# Patient Record
Sex: Female | Born: 1975 | Race: Black or African American | Hispanic: No | Marital: Single | Smoking: Never smoker
Health system: Southern US, Community
[De-identification: ages and names within clinical notes are randomized; demographics above are authoritative.]

## PROBLEM LIST (undated history)

## (undated) DIAGNOSIS — N390 Urinary tract infection, site not specified: Secondary | ICD-10-CM

## (undated) DIAGNOSIS — I10 Essential (primary) hypertension: Secondary | ICD-10-CM

---

## 2003-04-05 ENCOUNTER — Other Ambulatory Visit: Admission: RE | Admit: 2003-04-05 | Discharge: 2003-04-05 | Payer: Self-pay | Admitting: Obstetrics and Gynecology

## 2003-04-06 ENCOUNTER — Other Ambulatory Visit: Admission: RE | Admit: 2003-04-06 | Discharge: 2003-04-06 | Payer: Self-pay | Admitting: Obstetrics and Gynecology

## 2003-04-08 ENCOUNTER — Ambulatory Visit (HOSPITAL_COMMUNITY): Admission: RE | Admit: 2003-04-08 | Discharge: 2003-04-08 | Payer: Self-pay | Admitting: Obstetrics and Gynecology

## 2004-02-08 ENCOUNTER — Other Ambulatory Visit: Admission: RE | Admit: 2004-02-08 | Discharge: 2004-02-08 | Payer: Self-pay | Admitting: Obstetrics and Gynecology

## 2004-08-10 ENCOUNTER — Inpatient Hospital Stay (HOSPITAL_COMMUNITY): Admission: AD | Admit: 2004-08-10 | Discharge: 2004-08-10 | Payer: Self-pay | Admitting: Obstetrics and Gynecology

## 2004-08-11 ENCOUNTER — Inpatient Hospital Stay (HOSPITAL_COMMUNITY): Admission: AD | Admit: 2004-08-11 | Discharge: 2004-08-11 | Payer: Self-pay | Admitting: Obstetrics and Gynecology

## 2004-08-18 ENCOUNTER — Inpatient Hospital Stay (HOSPITAL_COMMUNITY): Admission: AD | Admit: 2004-08-18 | Discharge: 2004-08-21 | Payer: Self-pay | Admitting: Obstetrics and Gynecology

## 2004-09-29 ENCOUNTER — Other Ambulatory Visit: Admission: RE | Admit: 2004-09-29 | Discharge: 2004-09-29 | Payer: Self-pay | Admitting: Obstetrics and Gynecology

## 2005-05-22 IMAGING — US US OB LIMITED
1 series · 18 of 28 positions shown · non-contrast
Comparison: none

CLINICAL DATA: 37 weeks gestation with contractions.

[Series 1: us fetal bpp w/o nonstress · non-contrast · 18 of 29 slices shown]
[im 1/29]
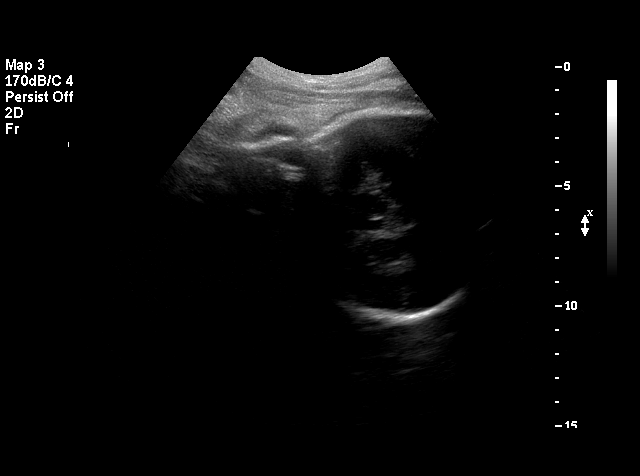
[im 3/29]
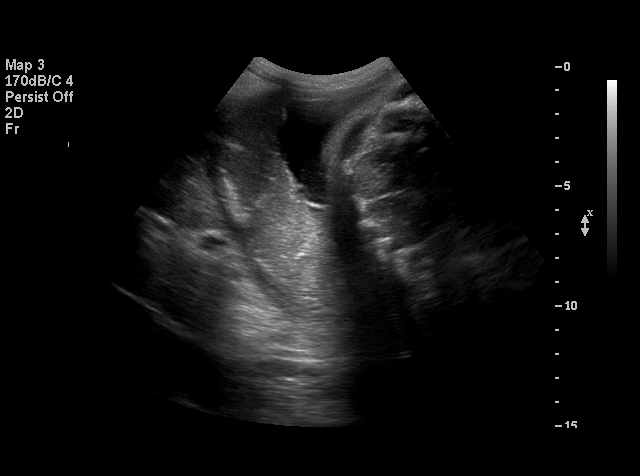
[im 4/29]
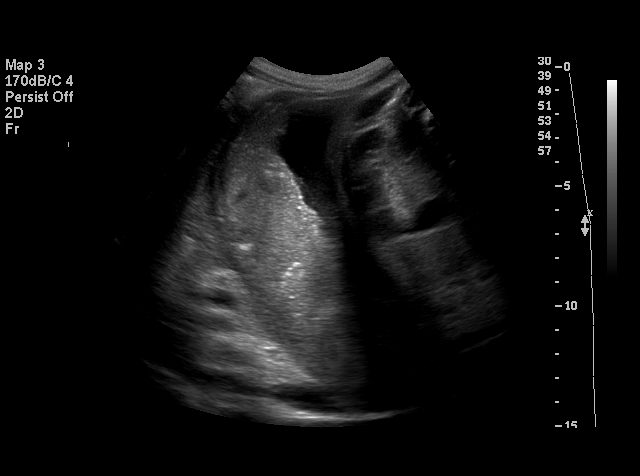
[im 6/29]
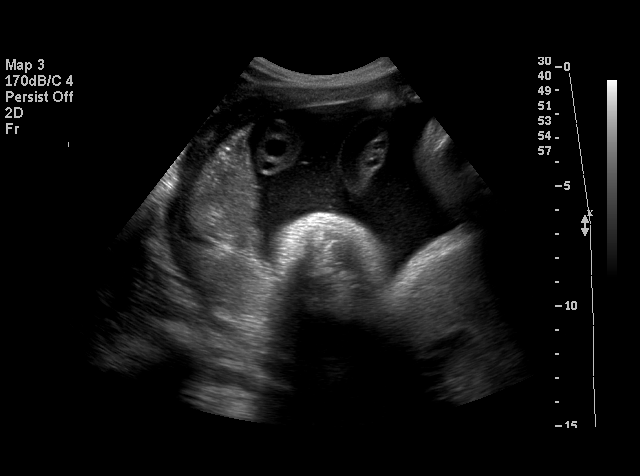
[im 8/29]
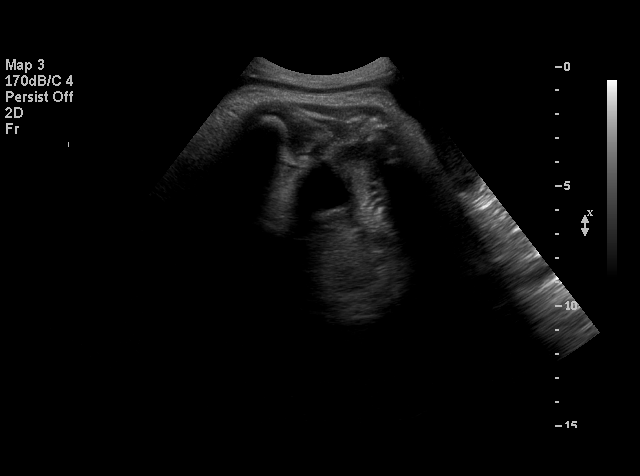
[im 9/29]
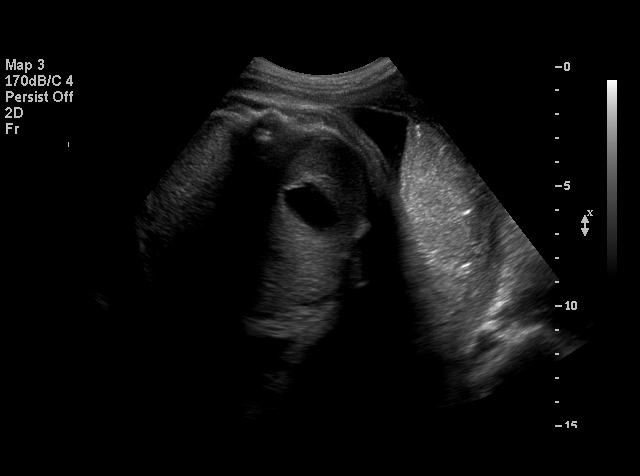
[im 11/29]
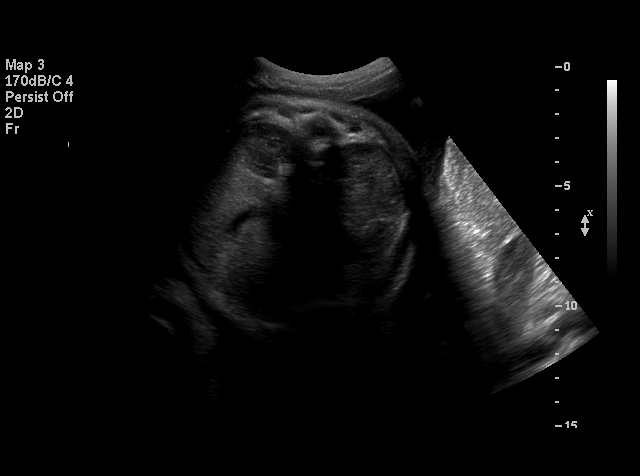
[im 12/29]
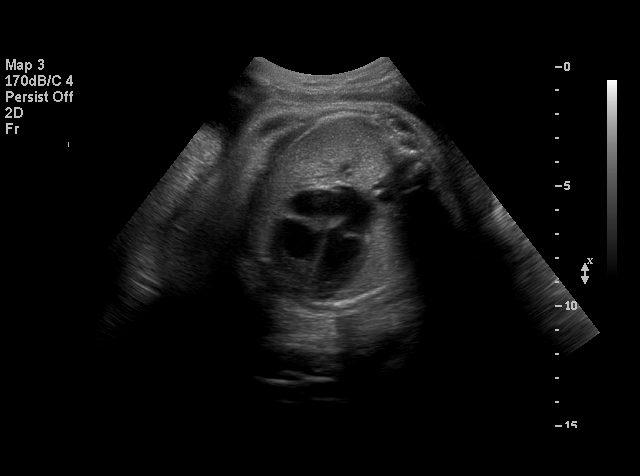
[im 14/29]
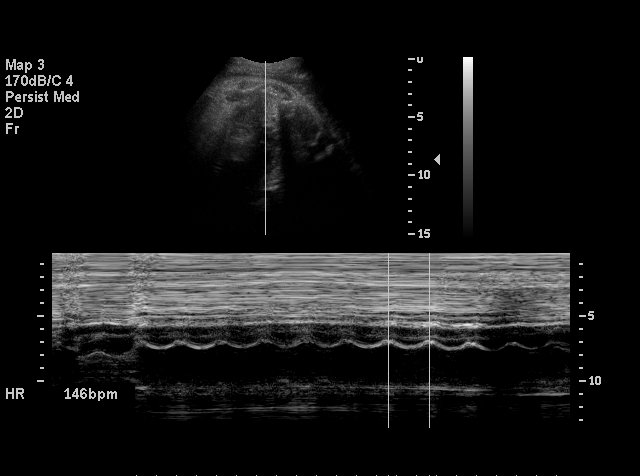
[im 15/29]
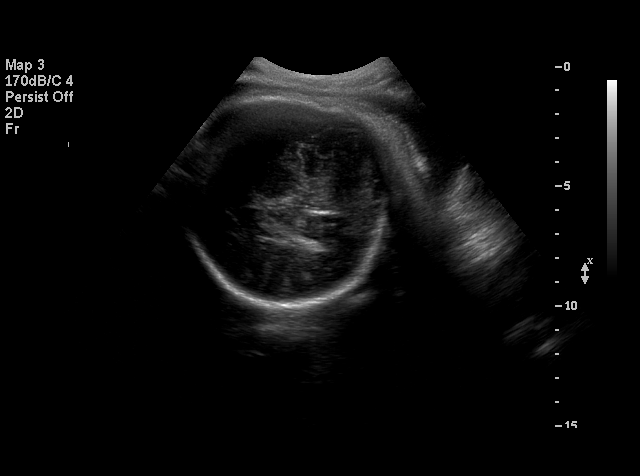
[im 17/29]
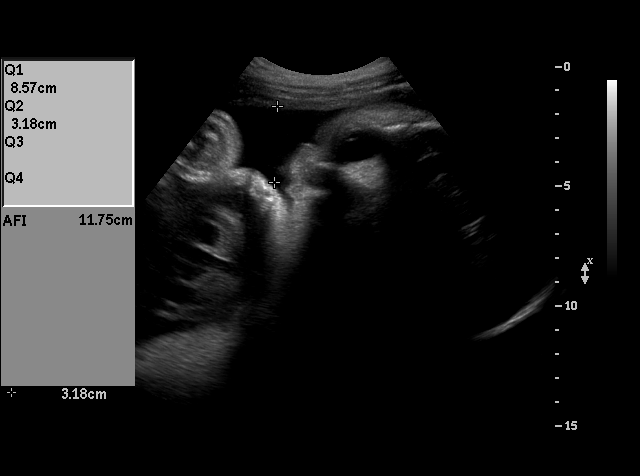
[im 18/29]
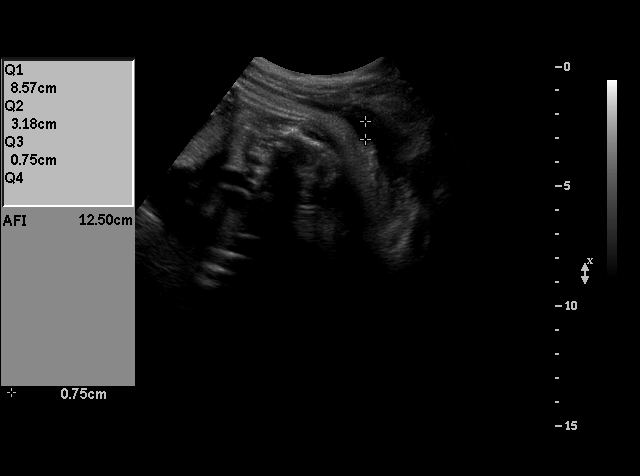
[im 20/29]
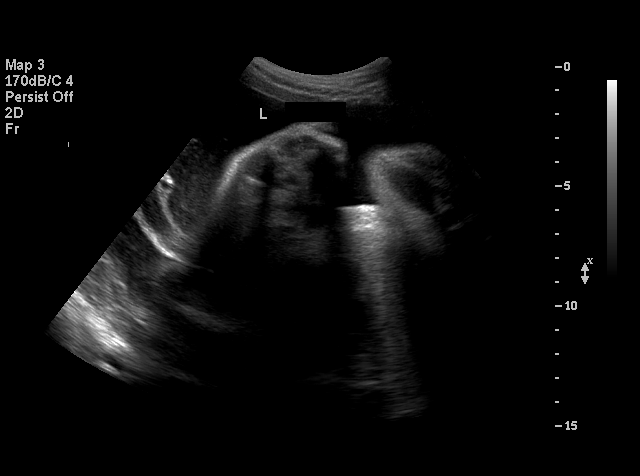
[im 22/29]
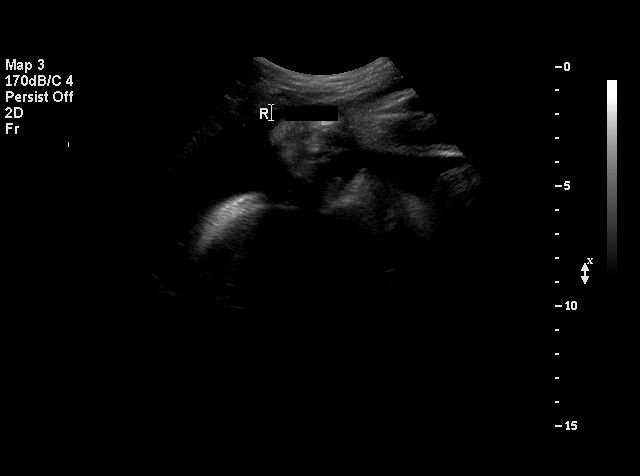
[im 23/29]
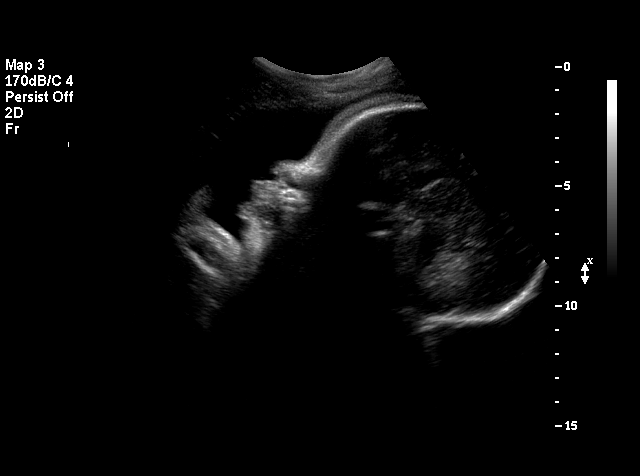
[im 25/29]
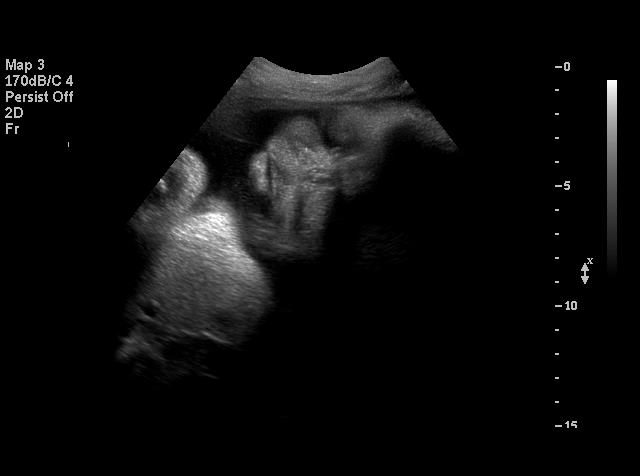
[im 26/29]
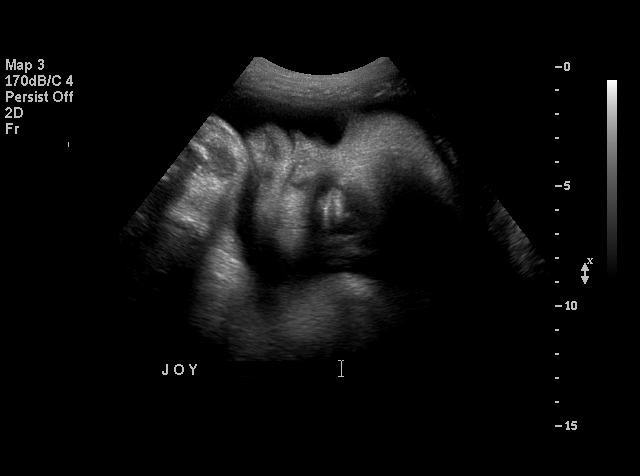
[im 29/29]
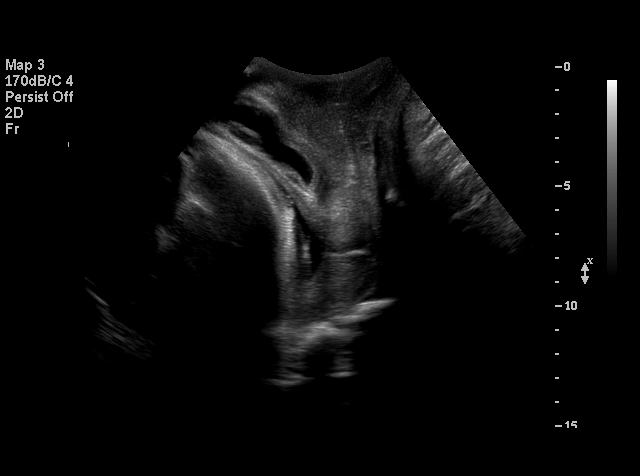

[18 of 28 positions shown; findings below may reference images not displayed]

LIMITED OBSTETRICAL ULTRASOUND:
 Number of Fetuses:  1
 Heart Rate:  146 bpm
 Movement:  yes  
 Breathing: yes
 Presentation: cephalic
 Placental Location: posterior
 Grade: 2
 Previa: no
 Amniotic Fluid (Subjective): normal
 Amniotic Fluid (Objective):  AFI 14.6 cm (6th-76th %ile = 7.5 to 24.4 cm for 37 weeks) 

 Fetal measurements and complete anatomic evaluation were not requested.  The following fetal anatomy was visualized during this exam: lateral ventricles, four-chamber heart, stomach, three-vessel cord, kidneys, bladder, and upper lip.

 BIOPHYSICAL PROFILE

 Movement:    2  Time: 10 minutes
 Breathing:  2
 Tone:  2
 Amniotic Fluid:   2

 Total Score:  8
 MATERNAL FINDINGS
 Cervix:  2.2 cm translabially.
IMPRESSION: 1.  Single living intrauterine fetus in cephalic presentation with subjectively and quantitatively normal amniotic fluid volume.
 2.  Visualized fetal anatomy is unremarkable on the current exam, although a dedicated fetal anatomic assessment was not performed.  Quality of anatomic assessment is limited by the advanced gestational age.
 3.  Biophysical profile score is [DATE] after 10 minutes of observation.

## 2015-06-04 ENCOUNTER — Emergency Department (HOSPITAL_COMMUNITY)
Admission: EM | Admit: 2015-06-04 | Discharge: 2015-06-05 | Disposition: A | Discharge status: Home / Self Care | Payer: Medicaid Other | Attending: Emergency Medicine | Admitting: Emergency Medicine

## 2015-06-04 ENCOUNTER — Emergency Department (HOSPITAL_COMMUNITY): Payer: Medicaid Other

## 2015-06-04 ENCOUNTER — Encounter (HOSPITAL_COMMUNITY): Payer: Self-pay

## 2015-06-04 DIAGNOSIS — R52 Pain, unspecified: Secondary | ICD-10-CM | POA: Diagnosis not present

## 2015-06-04 DIAGNOSIS — I10 Essential (primary) hypertension: Secondary | ICD-10-CM | POA: Diagnosis not present

## 2015-06-04 DIAGNOSIS — Z3202 Encounter for pregnancy test, result negative: Secondary | ICD-10-CM | POA: Insufficient documentation

## 2015-06-04 DIAGNOSIS — R509 Fever, unspecified: Secondary | ICD-10-CM | POA: Insufficient documentation

## 2015-06-04 DIAGNOSIS — Z87440 Personal history of urinary (tract) infections: Secondary | ICD-10-CM | POA: Insufficient documentation

## 2015-06-04 HISTORY — DX: Essential (primary) hypertension: I10

## 2015-06-04 HISTORY — DX: Urinary tract infection, site not specified: N39.0

## 2015-06-04 LAB — URINE MICROSCOPIC-ADD ON

## 2015-06-04 LAB — COMPREHENSIVE METABOLIC PANEL
ALK PHOS: 72 U/L (ref 38–126)
ALT: 17 U/L (ref 14–54)
ANION GAP: 9 (ref 5–15)
AST: 22 U/L (ref 15–41)
Albumin: 4 g/dL (ref 3.5–5.0)
BUN: 12 mg/dL (ref 6–20)
CALCIUM: 9.3 mg/dL (ref 8.9–10.3)
CO2: 23 mmol/L (ref 22–32)
Chloride: 104 mmol/L (ref 101–111)
Creatinine, Ser: 1.02 mg/dL — ABNORMAL HIGH (ref 0.44–1.00)
GFR calc Af Amer: >60 mL/min (ref >60)
GFR calc non Af Amer: >60 mL/min (ref >60)
Glucose, Bld: 107 mg/dL — ABNORMAL HIGH (ref 65–99)
Potassium: 3.7 mmol/L (ref 3.5–5.1)
Sodium: 136 mmol/L (ref 135–145)
Total Bilirubin: 0.4 mg/dL (ref 0.3–1.2)
Total Protein: 8.2 g/dL — ABNORMAL HIGH (ref 6.5–8.1)

## 2015-06-04 LAB — URINALYSIS, ROUTINE W REFLEX MICROSCOPIC
BILIRUBIN URINE: NEGATIVE
Glucose, UA: NEGATIVE mg/dL
Ketones, ur: NEGATIVE mg/dL
Leukocytes, UA: NEGATIVE
Nitrite: NEGATIVE
Protein, ur: NEGATIVE mg/dL
Specific Gravity, Urine: 1.03 (ref 1.005–1.030)
Urobilinogen, UA: 0.2 mg/dL (ref 0.0–1.0)
pH: 5.5 (ref 5.0–8.0)

## 2015-06-04 LAB — CBC WITH DIFFERENTIAL/PLATELET
BASOS PCT: 0 % (ref 0–1)
Basophils Absolute: 0 10*3/uL (ref 0.0–0.1)
EOS ABS: 0.2 10*3/uL (ref 0.0–0.7)
EOS PCT: 5 % (ref 0–5)
HCT: 41.3 % (ref 36.0–46.0)
Hemoglobin: 14.7 g/dL (ref 12.0–15.0)
Lymphocytes Relative: 13 % (ref 12–46)
Lymphs Abs: 0.7 10*3/uL (ref 0.7–4.0)
MCH: 29.5 pg (ref 26.0–34.0)
MCHC: 35.6 g/dL (ref 30.0–36.0)
MCV: 82.8 fL (ref 78.0–100.0)
Monocytes Absolute: 0.4 10*3/uL (ref 0.1–1.0)
Monocytes Relative: 9 % (ref 3–12)
Neutro Abs: 3.8 10*3/uL (ref 1.7–7.7)
Neutrophils Relative %: 73 % (ref 43–77)
PLATELETS: 152 10*3/uL (ref 150–400)
RBC: 4.99 MIL/uL (ref 3.87–5.11)
RDW: 12.7 % (ref 11.5–15.5)
WBC: 5.2 10*3/uL (ref 4.0–10.5)

## 2015-06-04 LAB — PREGNANCY, URINE: Preg Test, Ur: NEGATIVE

## 2015-06-04 LAB — I-STAT CG4 LACTIC ACID, ED: Lactic Acid, Venous: 1.29 mmol/L (ref 0.5–2.0)

## 2015-06-04 MED ORDER — SODIUM CHLORIDE 0.9 % IV BOLUS (SEPSIS)
1000.0000 mL | Freq: Once | INTRAVENOUS | Status: AC
  Administered 2015-06-04: 1000 mL via INTRAVENOUS

## 2015-06-04 MED ORDER — DEXTROSE 5 % IV SOLN
1.0000 g | Freq: Once | INTRAVENOUS | Status: AC
  Administered 2015-06-04: 1 g via INTRAVENOUS
  Filled 2015-06-04: qty 10

## 2015-06-04 MED ORDER — KETOROLAC TROMETHAMINE 30 MG/ML IJ SOLN
30.0000 mg | Freq: Once | INTRAMUSCULAR | Status: AC
  Administered 2015-06-05: 30 mg via INTRAVENOUS
  Filled 2015-06-04: qty 1

## 2015-06-04 NOTE — ED Provider Notes (Signed)
CSN: [id number]     Arrival date & time 06/04/15  2149 History   First MD Initiated Contact with Patient 06/04/15 2217     Chief Complaint  Patient presents with  . Generalized Body Aches  . Urinary Tract Infection  . Fever     (Consider location/radiation/quality/duration/timing/severity/associated sxs/prior Treatment) HPI Comments: 39 year old female with history of hypertension who presents with fever and body aches. The patient states that almost one week ago, she went to her PCP for lower abdominal pain. Urine was sent for culture and she was called with positive culture results for UTI. She was given Bactrim 5 days ago which she has only taken once daily instead of twice daily as prescribed. Yesterday she began having body aches and fever up to 102 at home. She has been taking Tylenol. Her body aches and generalized malaise have persisted throughout today and she has had a constant, achy low back pain. She denies any vomiting, diarrhea, or abdominal pain. No dysuria or hematuria. No vaginal bleeding or discharge. No chest pain or shortness of breath. Last dose of Tylenol was 9:30 PM tonight.  Patient is a 39 y.o. female presenting with urinary tract infection and fever. The history is provided by the patient.  Urinary Tract Infection Associated symptoms: fever   Fever   Past Medical History  Diagnosis Date  . UTI (urinary tract infection)   . Hypertension    Past Surgical History  Procedure Laterality Date  . Cesarean section     History reviewed. No pertinent family history. Social History  Substance Use Topics  . Smoking status: Never Smoker   . Smokeless tobacco: None  . Alcohol Use: Yes     Comment: social   OB History    No data available     Review of Systems  Constitutional: Positive for fever.    10 Systems reviewed and are negative for acute change except as noted in the HPI.   Allergies  Review of patient's allergies indicates no known  allergies.  Home Medications   Prior to Admission medications   Not on File   BP 140/88 mmHg  Pulse 107  Temp(Src) 102.9 F (39.4 C) (Oral)  Resp 20  Ht 5\' 4"  (1.626 m)  Wt 145 lb (65.772 kg)  BMI 24.88 kg/m2  SpO2 98% Physical Exam  Constitutional: She is oriented to person, place, and time. She appears well-developed and well-nourished.  Uncomfortable but in no acute distress  HENT:  Head: Normocephalic and atraumatic.  Mouth/Throat: Oropharynx is clear and moist.  Moist mucous membranes  Eyes: Conjunctivae are normal. Pupils are equal, round, and reactive to light.  Neck: Neck supple.  Cardiovascular: Normal rate, regular rhythm and normal heart sounds.   No murmur heard. Pulmonary/Chest: Effort normal and breath sounds normal.  Abdominal: Soft. Bowel sounds are normal. She exhibits no distension. There is no tenderness.  Musculoskeletal: She exhibits no edema.  Neurological: She is alert and oriented to person, place, and time.  Fluent speech  Skin: Skin is warm and dry.  Psychiatric: She has a normal mood and affect. Judgment normal.  Nursing note and vitals reviewed.   ED Course  Procedures (including critical care time) Labs Review Labs Reviewed  CULTURE, BLOOD (ROUTINE X 2)  CULTURE, BLOOD (ROUTINE X 2)  URINE CULTURE  CBC WITH DIFFERENTIAL/PLATELET  COMPREHENSIVE METABOLIC PANEL  URINALYSIS, ROUTINE W REFLEX MICROSCOPIC (NOT AT [facility])  PREGNANCY, URINE  I-STAT CG4 LACTIC ACID, ED  I-STAT CG4 LACTIC ACID, ED  Imaging Review Dg Chest 2 View  06/04/2015   CLINICAL DATA:  Fever for 2 days.  Hypertension.  Smoker.  EXAM: CHEST  2 VIEW  COMPARISON:  04/22/2008  FINDINGS: The heart size and mediastinal contours are within normal limits. Both lungs are clear. The visualized skeletal structures are unremarkable.  IMPRESSION: No active cardiopulmonary disease.   Electronically Signed   By: Burman Nieves M.D.   On: 06/04/2015 22:25   I have personally  reviewed and evaluated these lab results as part of my medical decision-making.   EKG Interpretation None       Medications  ketorolac (TORADOL) 30 MG/ML injection 30 mg (not administered)  sodium chloride 0.9 % bolus 1,000 mL (0 mLs Intravenous Stopped 06/04/15 2334)  cefTRIAXone (ROCEPHIN) 1 g in dextrose 5 % 50 mL IVPB (0 g Intravenous Stopped 06/04/15 2329)  sodium chloride 0.9 % bolus 1,000 mL (1,000 mLs Intravenous New Bag/Given 06/04/15 2335)    MDM   Final diagnoses:  None   39 year old female who presents with 2 days of body aches and fever in the setting of recent diagnosis of UTI. She has not been compliant with Bactrim. At presentation, the patient was awake, alert, uncomfortable but nontoxic in appearance and in no acute distress. Vital signs notable for fever of 102.9 and tachycardia at 107. Blood pressure reassuring. No abdominal tenderness on exam. Sent above labwork to evaluate for infection, including blood and urine cultures. Initial lactate normal.  Gave the patient an IV fluid bolus and ceftriaxone. CXR ordered from triage was unremarkable. UPT negative, gave toradol.  Labs show an unremarkable UA, normal lactate, creatinine 1.02, and otherwise unremarkable CBC and CMP. The patient's labs are reassuring against pyelonephritis. I reexamine the patient after results and she has no midline spinal tenderness or focal back pain to suggest epidural abscess or discitis. Furthermore, her WBC count is normal which is reassuring against bacterial infection. She denies any sore throat to suggest pharyngitis. No significant outdoor exposure or tick bites thus tick borne illness is unlikely. No abdominal pain or tenderness on exam to suggest intraabdominal process. Patient is nontoxic in appearance and has no headache, rash, or neck stiffness. Negative Brudzinski's sign. I have considered meningitis but feel that without these other symptoms, it is unlikely cause of her fever. Her symptoms  of body aches and malaise are more consistent with viral syndrome. I discussed results with patient and risks/benefits of lumbar puncture given very small risk of meningitis. I explained that although my suspicion is low, I could perform LP to rule it out. Patient voiced understanding of risks associated with not testing and elected not to undergo LP.   On reexamination, the patient is well-appearing with normal vital signs and states that her body aches have improved after Toradol. She has been ambulatory in ED to bathroom several times. I extensively reviewed return precautions including worsening symptoms, signs of meningitis, or persistent fever. Pt voiced understanding and was discharged in satisfactory condition.  Laurence Spates, MD 06/05/15 [phone]

## 2015-06-04 NOTE — ED Notes (Addendum)
Pt not feeling good. Dx with UTI last Monday and has been taking antibiotic, Bactrim for five days but has only been taking once a day, filled on 8/22. Having body aches and took temp earlier tonight it was 102.1 and took tylenol at 2120.

## 2015-06-05 LAB — I-STAT CG4 LACTIC ACID, ED: Lactic Acid, Venous: 0.46 mmol/L — ABNORMAL LOW (ref 0.5–2.0)

## 2015-06-05 NOTE — ED Notes (Signed)
Pt verbalized understanding of d/c instructions and has no further questions. Pt stable and NAD.  

## 2015-06-05 NOTE — Discharge Instructions (Signed)

## 2015-06-06 ENCOUNTER — Encounter (HOSPITAL_COMMUNITY): Payer: Self-pay | Admitting: Emergency Medicine

## 2015-06-06 ENCOUNTER — Emergency Department (HOSPITAL_COMMUNITY)
Admission: EM | Admit: 2015-06-06 | Discharge: 2015-06-06 | Disposition: A | Discharge status: Home / Self Care | Payer: Medicaid Other | Attending: Emergency Medicine | Admitting: Emergency Medicine

## 2015-06-06 DIAGNOSIS — R590 Localized enlarged lymph nodes: Secondary | ICD-10-CM | POA: Insufficient documentation

## 2015-06-06 DIAGNOSIS — R52 Pain, unspecified: Secondary | ICD-10-CM | POA: Insufficient documentation

## 2015-06-06 DIAGNOSIS — Z87440 Personal history of urinary (tract) infections: Secondary | ICD-10-CM | POA: Diagnosis not present

## 2015-06-06 DIAGNOSIS — R112 Nausea with vomiting, unspecified: Secondary | ICD-10-CM | POA: Diagnosis not present

## 2015-06-06 DIAGNOSIS — I10 Essential (primary) hypertension: Secondary | ICD-10-CM | POA: Insufficient documentation

## 2015-06-06 LAB — URINE CULTURE

## 2015-06-06 LAB — I-STAT CHEM 8, ED
BUN: 9 mg/dL (ref 6–20)
CALCIUM ION: 1.17 mmol/L (ref 1.12–1.23)
Chloride: 104 mmol/L (ref 101–111)
Creatinine, Ser: 0.8 mg/dL (ref 0.44–1.00)
GLUCOSE: 111 mg/dL — AB (ref 65–99)
HCT: 43 % (ref 36.0–46.0)
Hemoglobin: 14.6 g/dL (ref 12.0–15.0)
Potassium: 3.7 mmol/L (ref 3.5–5.1)
Sodium: 138 mmol/L (ref 135–145)
TCO2: 20 mmol/L (ref 0–100)

## 2015-06-06 MED ORDER — ONDANSETRON 4 MG PO TBDP: 4.0000 mg | ORAL_TABLET | Freq: Three times a day (TID) | ORAL | Status: AC | PRN

## 2015-06-06 MED ORDER — ONDANSETRON 4 MG PO TBDP
4.0000 mg | ORAL_TABLET | Freq: Once | ORAL | Status: AC
  Administered 2015-06-06: 4 mg via ORAL
  Filled 2015-06-06: qty 1

## 2015-06-06 MED ORDER — TRAMADOL HCL 50 MG PO TABS: 50.0000 mg | ORAL_TABLET | Freq: Four times a day (QID) | ORAL | Status: AC | PRN

## 2015-06-06 MED ORDER — AMOXICILLIN-POT CLAVULANATE 875-125 MG PO TABS
1.0000 | ORAL_TABLET | Freq: Once | ORAL | Status: AC
  Administered 2015-06-06: 1 via ORAL
  Filled 2015-06-06: qty 1

## 2015-06-06 MED ORDER — AMOXICILLIN-POT CLAVULANATE 500-125 MG PO TABS: 1.0000 | ORAL_TABLET | Freq: Three times a day (TID) | ORAL | Status: AC

## 2015-06-06 MED ORDER — TRAMADOL HCL 50 MG PO TABS
50.0000 mg | ORAL_TABLET | Freq: Once | ORAL | Status: AC
  Administered 2015-06-06: 50 mg via ORAL
  Filled 2015-06-06: qty 1

## 2015-06-06 NOTE — ED Provider Notes (Signed)
CSN: [id number]     Arrival date & time 06/06/15  4098 History  This chart was scribed for non-physician practitioner, Marlon Pel, PA-C, working with Mancel Bale, MD, by Ronney Lion, ED Scribe. This patient was seen in room TR10C/TR10C and the patient's care was started at 10:11 AM.    Chief Complaint  Patient presents with  . Lymphadenopathy  . Nausea  . Emesis   The history is provided by the patient. No language interpreter was used.   Vicki Fletcher 39 y.o.female  PCP: ANDY,CAMILLE L, MD  Blood pressure 141/96, pulse 105, temperature 98.7 F (37.1 C), temperature source Oral, resp. rate 18, height 5\' 4"  (1.626 m), weight 145 lb (65.772 kg), SpO2 98 %.  SIGNIFICANT PMH: N/A CHIEF COMPLAINT: Cervical lymphadenopathy and body aches  When: 1 day How: Pt saw her PCP about 1 week for pelvic pressure, had a urine culture done, and was diagnosed with a UTI. She was sent home with Bactrim, but she has had nausea with the Bactrim, so she has only been taking it qd instead of BID as prescribed, still has not completed dose. She states she spit the Bactrim up twice when she tried to take it with food this morning, no food in her vomitus. Her pelvic pressure resolved, but pt was then seen here 2 days ago for fever of 102.9, generalized myalgias, and a feeling of malaise. At that time, she was told she had unspecified fever- normal UA. Although per chart review, UA, CXR, and blood tests at that time were all unremarkable. She then went home and slept feeling better. She states she woke up this morning feeling body aches, with new-onset cervical lymphadenopathy. Frequency: New onset Duration: 1 day Location: neck Radiation: N/A Quality: N/A Alleviating factors: None Worsening factors: None Treatments tried: Ibuprofen Associated Symptoms: nausea Negative ROS: Confusion, diaphoresis, headache, weakness (general or focal), change of vision, dysphagia, aphagia, chest pain, shortness of breath,   back pain, abdominal pains (Pt states her abdomen feels "queasy" but not painful), diarrhea, lower extremity swelling, rash, sore throat.   Past Medical History  Diagnosis Date  . UTI (urinary tract infection)   . Hypertension    Past Surgical History  Procedure Laterality Date  . Cesarean section     No family history on file. Social History  Substance Use Topics  . Smoking status: Never Smoker   . Smokeless tobacco: None  . Alcohol Use: Yes     Comment: social   OB History    No data available     Review of Systems  A complete 10 system review of systems was obtained and all systems are negative except as noted in the HPI and PMH.    Allergies  Review of patient's allergies indicates no known allergies.  Home Medications   Prior to Admission medications   Medication Sig Start Date End Date Taking? Authorizing Provider  amoxicillin-clavulanate (AUGMENTIN) 500-125 MG per tablet Take 1 tablet (500 mg total) by mouth every 8 (eight) hours. 06/06/15   Taygan Connell Neva Seat, PA-C  ondansetron (ZOFRAN ODT) 4 MG disintegrating tablet Take 1 tablet (4 mg total) by mouth every 8 (eight) hours as needed for nausea or vomiting. 06/06/15   Marlon Pel, PA-C  traMADol (ULTRAM) 50 MG tablet Take 1 tablet (50 mg total) by mouth every 6 (six) hours as needed. 06/06/15   Teddie Mehta Neva Seat, PA-C   BP 126/88 mmHg  Pulse 85  Temp(Src) 98.7 F (37.1 C) (Oral)  Resp 18  Ht  (1.626 m)  Wt 145 lb (65.772 kg)  BMI 24.88 kg/m2  SpO2 99% Physical Exam  Constitutional: She is oriented to person, place, and time. She appears well-developed and well-nourished. No distress.  Well-appearing  HENT:  Head: Normocephalic and atraumatic.  Eyes: Conjunctivae and EOM are normal.  Neck: Neck supple. No tracheal deviation present.  Pulmonary/Chest: Effort normal and breath sounds normal. No respiratory distress.  Abdominal: Soft. Bowel sounds are normal. She exhibits no distension. There is no tenderness.  There is no rigidity, no rebound and no guarding.  Musculoskeletal: Normal range of motion.  Lymphadenopathy:       Head (right side): Tonsillar and preauricular adenopathy present.       Head (left side): Tonsillar and preauricular adenopathy present.    She has cervical adenopathy.       Right cervical: Superficial cervical and posterior cervical adenopathy present.       Left cervical: Superficial cervical and posterior cervical adenopathy present.  Neurological: She is alert and oriented to person, place, and time.  Skin: Skin is warm and dry.  No rash or purpura  Psychiatric: She has a normal mood and affect. Her speech is normal and behavior is normal.  Nursing note and vitals reviewed.   ED Course  Procedures (including critical care time)  DIAGNOSTIC STUDIES: Oxygen Saturation is 98% on RA, normal by my interpretation.    COORDINATION OF CARE: 10:14 AM - Suspect viral infection. Will administer Zofran and Ultram for generalized myalgias and nausea.  Discussed this with Pt verbalized understanding and agreed to plan.   11:33 AM - On recheck, pt reports feeling much better after medications administered here. Lab results reviewed with pt - suspect viral infection. However, we will cover for possible bacterial infection with Rx Augmentin, as pt has had nausea with Bactrim, which we will discontinue. Pt states she tolerated her first dose of Augmentin here well. Discussed this treatment plan with pt, who verbalized understanding and agreed to plan.   Labs Review Labs Reviewed  I-STAT CHEM 8, ED - Abnormal; Notable for the following:    Glucose, Bld 111 (*)    All other components within normal limits    Imaging Review No results found. I have personally reviewed and evaluated these images and lab results as part of my medical decision-making.  MDM   Final diagnoses:  Cervical lymphadenopathy  Body aches   Patient is well appearing, afebrile, here with concerns for  lymphadenopathy and body aches. She was seen two days ago for fever in which the etiology was not determined. UA, chest xray and blood work were unremarkable. Today she is afebrile and does not appear ill. She has not had true vomiting, she reports spitting up her abx before she could get it down. Will dc the bactrim and switch her to Augmentin. Chem-8 in the ER is reassuring. She was PO challenged with Zofran, Ultram and Augmentin. She did well, feels improvement. Patient will be referred back to her PCP with a referral to ENT.  Medications  ondansetron (ZOFRAN-ODT) disintegrating tablet 4 mg (4 mg Oral Given 06/06/15 1048)  traMADol (ULTRAM) tablet 50 mg (50 mg Oral Given 06/06/15 1048)  amoxicillin-clavulanate (AUGMENTIN) 875-125 MG per tablet 1 tablet (1 tablet Oral Given 06/06/15 1048)    39 y.o.Smrithi A Equihua's evaluation in the Emergency Department is complete. It has been determined that no acute conditions requiring further emergency intervention are present at this time. The patient/guardian have been advised  of the diagnosis and plan. We have discussed signs and symptoms that warrant return to the ED, such as changes or worsening in symptoms.  Vital signs are stable at discharge. Filed Vitals:   06/06/15 1143  BP: 126/88  Pulse: 85  Temp:   Resp: 18    Patient/guardian has voiced understanding and agreed to follow-up with the PCP or specialist.    I personally performed the services described in this documentation, which was scribed in my presence. The recorded information has been reviewed and is accurate.    Marlon Pel, PA-C 06/09/15 2211  Mancel Bale, MD 06/12/15 [phone]

## 2015-06-06 NOTE — Discharge Instructions (Signed)
Lymphadenopathy °Lymphadenopathy means "disease of the lymph glands." But the term is usually used to describe swollen or enlarged lymph glands, also called lymph nodes. These are the bean-shaped organs found in many locations including the neck, underarm, and groin. Lymph glands are part of the immune system, which fights infections in your body. Lymphadenopathy can occur in just one area of the body, such as the neck, or it can be generalized, with lymph node enlargement in several areas. The nodes found in the neck are the most common sites of lymphadenopathy. °CAUSES °When your immune system responds to germs (such as viruses or bacteria ), infection-fighting cells and fluid build up. This causes the glands to grow in size. Usually, this is not something to worry about. Sometimes, the glands themselves can become infected and inflamed. This is called lymphadenitis. °Enlarged lymph nodes can be caused by many diseases: °· Bacterial disease, such as strep throat or a skin infection. °· Viral disease, such as a common cold. °· Other germs, such as Lyme disease, tuberculosis, or sexually transmitted diseases. °· Cancers, such as lymphoma (cancer of the lymphatic system) or leukemia (cancer of the white blood cells). °· Inflammatory diseases such as lupus or rheumatoid arthritis. °· Reactions to medications. °Many of the diseases above are rare, but important. This is why you should see your caregiver if you have lymphadenopathy. °SYMPTOMS °· Swollen, enlarged lumps in the neck, back of the head, or other locations. °· Tenderness. °· Warmth or redness of the skin over the lymph nodes. °· Fever. °DIAGNOSIS °Enlarged lymph nodes are often near the source of infection. They can help health care providers diagnose your illness. For instance: °· Swollen lymph nodes around the jaw might be caused by an infection in the mouth. °· Enlarged glands in the neck often signal a throat infection. °· Lymph nodes that are swollen in  more than one area often indicate an illness caused by a virus. °Your caregiver will likely know what is causing your lymphadenopathy after listening to your history and examining you. Blood tests, x-rays, or other tests may be needed. If the cause of the enlarged lymph node cannot be found, and it does not go away by itself, then a biopsy may be needed. Your caregiver will discuss this with you. °TREATMENT °Treatment for your enlarged lymph nodes will depend on the cause. Many times the nodes will shrink to normal size by themselves, with no treatment. Antibiotics or other medicines may be needed for infection. Only take over-the-counter or prescription medicines for pain, discomfort, or fever as directed by your caregiver. °HOME CARE INSTRUCTIONS °Swollen lymph glands usually return to normal when the underlying medical condition goes away. If they persist, contact your health-care provider. He/she might prescribe antibiotics or other treatments, depending on the diagnosis. Take any medications exactly as prescribed. Keep any follow-up appointments made to check on the condition of your enlarged nodes. °SEEK MEDICAL CARE IF: °· Swelling lasts for more than two weeks. °· You have symptoms such as weight loss, night sweats, fatigue, or fever that does not go away. °· The lymph nodes are hard, seem fixed to the skin, or are growing rapidly. °· Skin over the lymph nodes is red and inflamed. This could mean there is an infection. °SEEK IMMEDIATE MEDICAL CARE IF: °· Fluid starts leaking from the area of the enlarged lymph node. °· You develop a fever of 102° F (38.9° C) or greater. °· Severe pain develops (not necessarily at the site of a   large lymph node).  You develop chest pain or shortness of breath.  You develop worsening abdominal pain. MAKE SURE YOU:  Understand these instructions.  Will watch your condition.  Will get help right away if you are not doing well or get worse. Document Released:  07/03/2008 Document Revised: 02/08/2014 Document Reviewed: 07/03/2008 [facility] Patient Information 2015 [place], [place]. This information is not intended to replace advice given to you by your health care provider. Make sure you discuss any questions you have with your health care provider. Influenza Influenza ("the flu") is a viral infection of the respiratory tract. It occurs more often in winter months because people spend more time in close contact with one another. Influenza can make you feel very sick. Influenza easily spreads from person to person (contagious). CAUSES  Influenza is caused by a virus that infects the respiratory tract. You can catch the virus by breathing in droplets from an infected person's cough or sneeze. You can also catch the virus by touching something that was recently contaminated with the virus and then touching your mouth, nose, or eyes. RISKS AND COMPLICATIONS You may be at risk for a more severe case of influenza if you smoke cigarettes, have diabetes, have chronic heart disease (such as heart failure) or lung disease (such as asthma), or if you have a weakened immune system. Elderly people and pregnant women are also at risk for more serious infections. The most common problem of influenza is a lung infection (pneumonia). Sometimes, this problem can require emergency medical care and may be life threatening. SIGNS AND SYMPTOMS  Symptoms typically last 4 to 10 days and may include:  Fever.  Chills.  Headache, body aches, and muscle aches.  Sore throat.  Chest discomfort and cough.  Poor appetite.  Weakness or feeling tired.  Dizziness.  Nausea or vomiting. DIAGNOSIS  Diagnosis of influenza is often made based on your history and a physical exam. A nose or throat swab test can be done to confirm the diagnosis. TREATMENT  In mild cases, influenza goes away on its own. Treatment is directed at relieving symptoms. For more severe cases, your health care  provider may prescribe antiviral medicines to shorten the sickness. Antibiotic medicines are not effective because the infection is caused by a virus, not by bacteria. HOME CARE INSTRUCTIONS  Take medicines only as directed by your health care provider.  Use a cool mist humidifier to make breathing easier.  Get plenty of rest until your temperature returns to normal. This usually takes 3 to 4 days.  Drink enough fluid to keep your urine clear or pale yellow.  Cover yourmouth and nosewhen coughing or sneezing,and wash your handswellto prevent thevirusfrom spreading.  Stay homefromwork orschool untilthe fever is gonefor at least 54full day. PREVENTION  An annual influenza vaccination (flu shot) is the best way to avoid getting influenza. An annual flu shot is now routinely recommended for all adults in the [place] SEEK MEDICAL CARE IF:  You experiencechest pain, yourcough worsens,or you producemore mucus.  Youhave nausea,vomiting, ordiarrhea.  Your fever returns or gets worse. SEEK IMMEDIATE MEDICAL CARE IF:  You havetrouble breathing, you become short of breath,or your skin ornails becomebluish.  You have severe painor stiffnessin the neck.  You develop a sudden headache, or pain in the face or ear.  You have nausea or vomiting that you cannot control. MAKE SURE YOU:   Understand these instructions.  Will watch your condition.  Will get help right away if you are not  doing well or get worse. Document Released: 09/21/2000 Document Revised: 02/08/2014 Document Reviewed: 12/24/2011 [facility] Patient Information 2015 [place], [place]. This information is not intended to replace advice given to you by your health care provider. Make sure you discuss any questions you have with your health care provider.

## 2015-06-06 NOTE — ED Notes (Signed)
Patient states has been treated for UTI recently and been on antibiotics.   Patient states she has n/v with the antibiotics.  Patient complains of neck pain and states the last time she was here, "someone told me it might be meningitis".

## 2015-06-09 LAB — CULTURE, BLOOD (ROUTINE X 2): Culture: NO GROWTH

## 2015-06-10 LAB — CULTURE, BLOOD (ROUTINE X 2): Culture: NO GROWTH

## 2016-03-14 IMAGING — CR DG CHEST 2V
2 series · 2 of 2 positions shown · non-contrast
Comparison: 04/22/2008

CLINICAL DATA: Fever for 2 days.  Hypertension.  Smoker.

EXAM:
CHEST  2 VIEW

[chest pa]
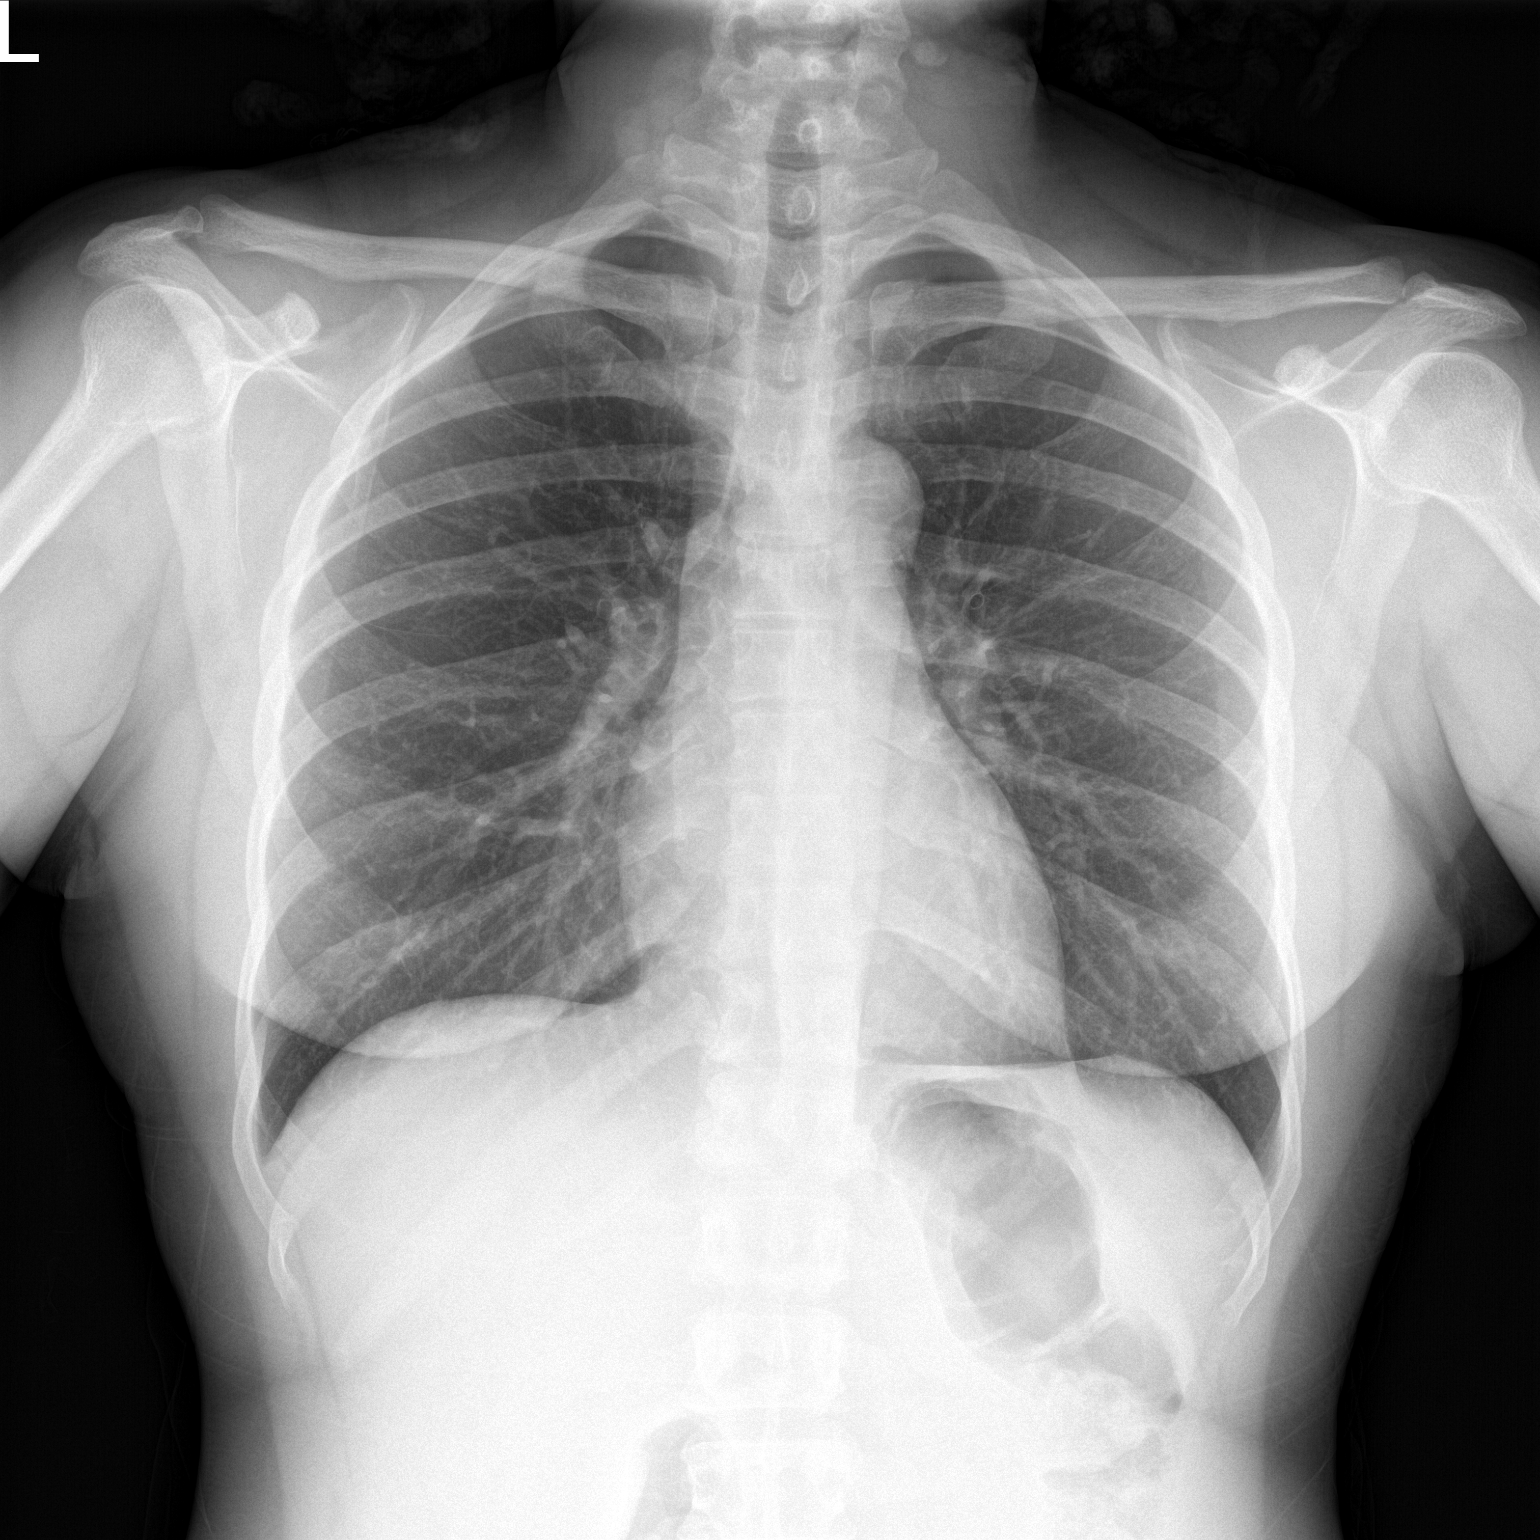

[chest lat]
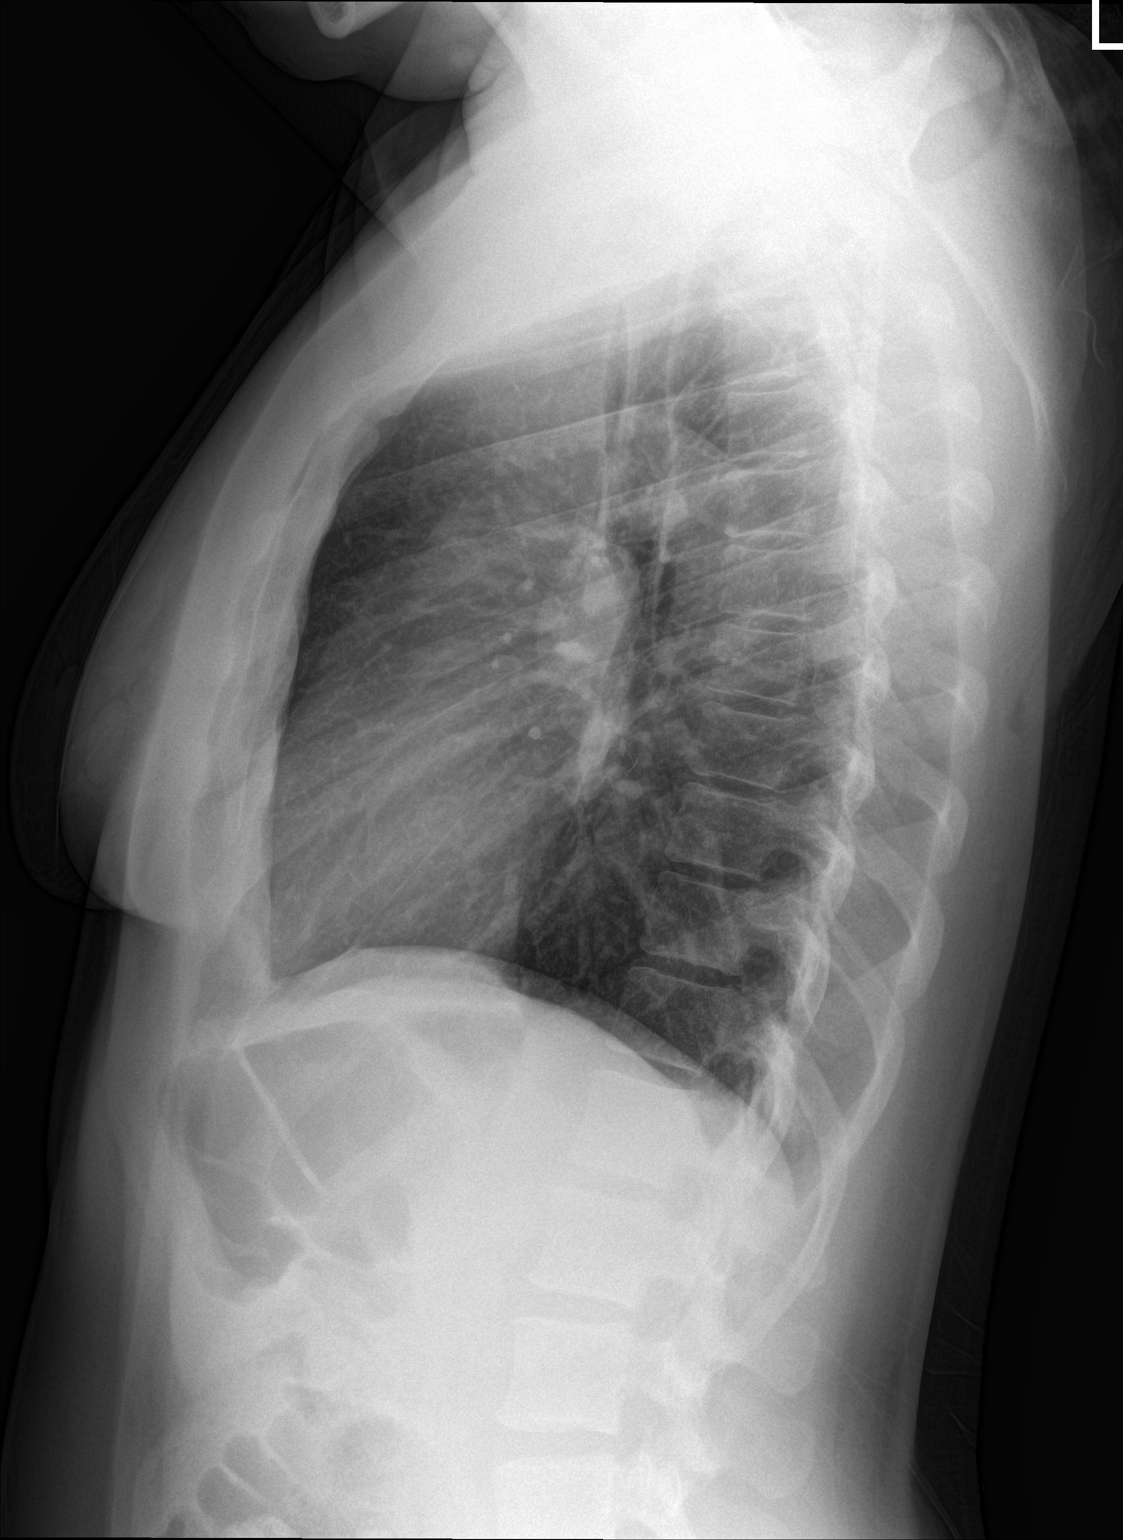

[2 of 2 positions shown; findings below may reference images not displayed]

FINDINGS: The heart size and mediastinal contours are within normal limits.
Both lungs are clear. The visualized skeletal structures are
unremarkable.
IMPRESSION: No active cardiopulmonary disease.

## 2023-07-27 LAB — COLOGUARD: COLOGUARD: NEGATIVE

## 2024-11-19 ENCOUNTER — Ambulatory Visit: Admitting: Primary Care
